# Patient Record
Sex: Male | Born: 1980 | Race: White | Hispanic: No | Marital: Married | Smoking: Current every day smoker
Health system: Southern US, Community
[De-identification: ages and names within clinical notes are randomized; demographics above are authoritative.]

---

## 2014-01-18 ENCOUNTER — Emergency Department (HOSPITAL_COMMUNITY)
Admission: EM | Admit: 2014-01-18 | Discharge: 2014-01-18 | Disposition: A | Discharge status: Home / Self Care | Payer: Self-pay | Attending: Emergency Medicine | Admitting: Emergency Medicine

## 2014-01-18 ENCOUNTER — Encounter (HOSPITAL_COMMUNITY): Payer: Self-pay | Admitting: Emergency Medicine

## 2014-01-18 ENCOUNTER — Emergency Department (HOSPITAL_COMMUNITY): Payer: Self-pay

## 2014-01-18 DIAGNOSIS — Z720 Tobacco use: Secondary | ICD-10-CM | POA: Insufficient documentation

## 2014-01-18 DIAGNOSIS — J9801 Acute bronchospasm: Secondary | ICD-10-CM | POA: Insufficient documentation

## 2014-01-18 MED ORDER — PREDNISONE 10 MG PO TABS: ORAL_TABLET | ORAL | Status: DC

## 2014-01-18 MED ORDER — PREDNISONE 50 MG PO TABS
60.0000 mg | ORAL_TABLET | Freq: Once | ORAL | Status: AC
  Administered 2014-01-18: 60 mg via ORAL
  Filled 2014-01-18 (×2): qty 1

## 2014-01-18 MED ORDER — ALBUTEROL SULFATE HFA 108 (90 BASE) MCG/ACT IN AERS
2.0000 | INHALATION_SPRAY | Freq: Once | RESPIRATORY_TRACT | Status: AC
  Administered 2014-01-18: 2 via RESPIRATORY_TRACT
  Filled 2014-01-18: qty 6.7

## 2014-01-18 MED ORDER — IPRATROPIUM-ALBUTEROL 0.5-2.5 (3) MG/3ML IN SOLN
3.0000 mL | Freq: Once | RESPIRATORY_TRACT | Status: AC
  Administered 2014-01-18: 3 mL via RESPIRATORY_TRACT
  Filled 2014-01-18: qty 3

## 2014-01-18 MED ORDER — AZITHROMYCIN 250 MG PO TABS: 250.0000 mg | ORAL_TABLET | Freq: Every day | ORAL | Status: DC

## 2014-01-18 MED ORDER — ALBUTEROL SULFATE (2.5 MG/3ML) 0.083% IN NEBU
2.5000 mg | INHALATION_SOLUTION | Freq: Once | RESPIRATORY_TRACT | Status: AC
  Administered 2014-01-18: 2.5 mg via RESPIRATORY_TRACT
  Filled 2014-01-18: qty 3

## 2014-01-18 NOTE — ED Provider Notes (Signed)
CSN: [id number]     Arrival date & time 01/18/14  1844 History    The patient was seen in room APFT23/APFT23 and the patient's care was started at 8:06 PM.    Chief Complaint  Patient presents with  . Shortness of Breath     HPI HPI Comments: Larry Levy is a 33 y.o. male who presents to the Emergency Department complaining of  cough, wheezing, and shortness of breath for 3 weeks. Patient reports worsening symptoms early in the morning and late evenings. States cough has been productive of clear sputum. He also reports wheezing and states his symptoms seem to improve during the day. He has been taking  over the counter cold and cough medications without relief. He also describes tightness in his chest with excessive coughing. He denies fever, chills,  sore throat, chest pain, or bloody sputum.   History reviewed. No pertinent past medical history. History reviewed. No pertinent past surgical history. History reviewed. No pertinent family history. History  Substance Use Topics  . Smoking status: Current Every Day Smoker  . Smokeless tobacco: Not on file  . Alcohol Use: Yes    Review of Systems  Constitutional: Negative for fever, chills, activity change, appetite change and fatigue.  HENT: Negative for congestion, ear discharge, facial swelling, sinus pressure, sore throat and trouble swallowing.   Eyes: Negative for discharge and visual disturbance.  Respiratory: Positive for cough, chest tightness, shortness of breath and wheezing. Negative for stridor.   Cardiovascular: Negative for chest pain.  Gastrointestinal: Negative for nausea, vomiting, abdominal pain and diarrhea.  Genitourinary: Negative for frequency and hematuria.  Musculoskeletal: Negative for back pain, neck pain and neck stiffness.  Skin: Negative.  Negative for rash.  Neurological: Negative for dizziness, seizures, weakness, numbness and headaches.  Hematological: Negative for adenopathy.   Psychiatric/Behavioral: Negative for hallucinations and confusion.  All other systems reviewed and are negative.     Allergies  Review of patient's allergies indicates no known allergies.  Home Medications   Prior to Admission medications   Not on File   BP 139/78  Pulse 74  Temp(Src) 98.8 F (37.1 C) (Oral)  Resp 18  Ht 5\' 10"  (1.778 m)  Wt 180 lb (81.647 kg)  BMI 25.83 kg/m2  SpO2 96% Physical Exam  Nursing note and vitals reviewed. Constitutional: He is oriented to person, place, and time. He appears well-developed and well-nourished. No distress.  HENT:  Head: Normocephalic and atraumatic.  Right Ear: Tympanic membrane and ear canal normal.  Left Ear: Tympanic membrane and ear canal normal.  Mouth/Throat: Uvula is midline, oropharynx is clear and moist and mucous membranes are normal. No oropharyngeal exudate.  Eyes: Conjunctivae and EOM are normal. Pupils are equal, round, and reactive to light. No scleral icterus.  Neck: Normal range of motion, full passive range of motion without pain and phonation normal. Neck supple. No thyromegaly present.  Cardiovascular: Normal rate, regular rhythm, normal heart sounds and intact distal pulses.  Exam reveals no gallop and no friction rub.   No murmur heard. Pulmonary/Chest: Effort normal. No stridor. No respiratory distress. He has wheezes. He has no rales. He exhibits no tenderness.  Coarse lungs sounds bilaterally with inspiratory and expiratory wheezes throughout. Slightly diminished breath sounds bilaterally. No rales.  Abdominal: Soft. He exhibits no distension. There is no tenderness. There is no rebound.  Musculoskeletal: Normal range of motion. He exhibits no edema.  Lymphadenopathy:    He has no cervical adenopathy.  Neurological: He is  alert and oriented to person, place, and time. He exhibits normal muscle tone. Coordination normal.  Skin: Skin is warm and dry. No rash noted. No erythema.  Psychiatric: He has a  normal mood and affect. His behavior is normal.    ED Course  Procedures (including critical care time) DIAGNOSTIC STUDIES: Oxygen Saturation is 96% on room air, normal by my interpretation.    COORDINATION OF CARE: 8:06 PM- Pt advised of plan for treatment and pt agrees.  Labs Review Labs Reviewed - No data to display  Imaging Review Dg Chest 2 View  01/18/2014   CLINICAL DATA:  33 year old male with productive cough and mild shortness of breath. History of smoking  EXAM: CHEST - 2 VIEW  COMPARISON:  None.  FINDINGS: Cardiomediastinal silhouette projects within normal limits in size and contour. No confluent airspace disease, pneumothorax, or pleural effusion.  No displaced fracture.  Unremarkable appearance of the upper abdomen.  IMPRESSION: No radiographic evidence of acute cardiopulmonary disease.  Signed,  Yvone NeuJaime S. Loreta AveWagner, DO  [facility]  [facility]   Electronically Signed   By: Gilmer MorJaime  Wagner D.O.   On: 01/18/2014 20:03     EKG Interpretation None      MDM   Final diagnoses:  Cough due to bronchospasm   Lung sounds greatly improved after neb. Vital signs are stable. Clinical suspicion for PE is low. Patient is nontoxic appearing. Patient agrees to albuterol inhaler , prednisone and Zithromax. He appears stable for discharge and agrees to care plan. Have also advised to return here for any worsening symptoms.     Venice Liz L. Trisha Mangleriplett, PA-C 01/20/14 1939

## 2014-01-18 NOTE — ED Notes (Signed)
Sob for 3 weeks, cough,

## 2014-01-18 NOTE — Discharge Instructions (Signed)
Bronchospasm A bronchospasm is when the tubes that carry air in and out of your lungs (airways) spasm or tighten. During a bronchospasm it is hard to breathe. This is because the airways get smaller. A bronchospasm can be triggered by:  Allergies. These may be to animals, pollen, food, or mold.  Infection. This is a common cause of bronchospasm.  Exercise.  Irritants. These include pollution, cigarette smoke, strong odors, aerosol sprays, and paint fumes.  Weather changes.  Stress.  Being emotional. HOME CARE   Always have a plan for getting help. Know when to call your doctor and local emergency services (911 in the [place] Know where you can get emergency care.  Only take medicines as told by your doctor.  If you were prescribed an inhaler or nebulizer machine, ask your doctor how to use it correctly. Always use a spacer with your inhaler if you were given one.  Stay calm during an attack. Try to relax and breathe more slowly.  Control your home environment:  Change your heating and air conditioning filter at least once a month.  Limit your use of fireplaces and wood stoves.  Do not  smoke. Do not  allow smoking in your home.  Avoid perfumes and fragrances.  Get rid of pests (such as roaches and mice) and their droppings.  Throw away plants if you see mold on them.  Keep your house clean and dust free.  Replace carpet with wood, tile, or vinyl flooring. Carpet can trap dander and dust.  Use allergy-proof pillows, mattress covers, and box spring covers.  Wash bed sheets and blankets every week in hot water. Dry them in a dryer.  Use blankets that are made of polyester or cotton.  Wash hands frequently. GET HELP IF:  You have muscle aches.  You have chest pain.  The thick spit you spit or cough up (sputum) changes from clear or white to yellow, green, gray, or bloody.  The thick spit you spit or cough up gets thicker.  There are problems that may be related  to the medicine you are given such as:  A rash.  Itching.  Swelling.  Trouble breathing. GET HELP RIGHT AWAY IF:  You feel you cannot breathe or catch your breath.  You cannot stop coughing.  Your treatment is not helping you breathe better.  You have very bad chest pain. MAKE SURE YOU:   Understand these instructions.  Will watch your condition.  Will get help right away if you are not doing well or get worse. Document Released: 01/18/2009 Document Revised: 03/28/2013 Document Reviewed: 09/13/2012 Exit[employer] Patient Information 2015 [employer]. This information is not intended to replace advice given to you by your health care provider. Make sure you discuss any questions you have with your health care provider.  

## 2014-01-21 NOTE — ED Provider Notes (Signed)
Medical screening examination/treatment/procedure(s) were performed by non-physician practitioner and as supervising physician I was immediately available for consultation/collaboration.   EKG Interpretation None        Reginold Beale L Friend Dorfman, MD 01/21/14 0025 

## 2014-04-24 ENCOUNTER — Encounter (HOSPITAL_COMMUNITY): Payer: Self-pay | Admitting: Emergency Medicine

## 2014-04-24 ENCOUNTER — Emergency Department (HOSPITAL_COMMUNITY)
Admission: EM | Admit: 2014-04-24 | Discharge: 2014-04-24 | Disposition: A | Discharge status: Home / Self Care | Payer: Self-pay | Attending: Emergency Medicine | Admitting: Emergency Medicine

## 2014-04-24 ENCOUNTER — Emergency Department (HOSPITAL_COMMUNITY): Payer: Self-pay

## 2014-04-24 DIAGNOSIS — Z792 Long term (current) use of antibiotics: Secondary | ICD-10-CM | POA: Insufficient documentation

## 2014-04-24 DIAGNOSIS — Z720 Tobacco use: Secondary | ICD-10-CM | POA: Insufficient documentation

## 2014-04-24 DIAGNOSIS — R059 Cough, unspecified: Secondary | ICD-10-CM

## 2014-04-24 DIAGNOSIS — J9801 Acute bronchospasm: Secondary | ICD-10-CM

## 2014-04-24 DIAGNOSIS — R05 Cough: Secondary | ICD-10-CM

## 2014-04-24 DIAGNOSIS — Z7952 Long term (current) use of systemic steroids: Secondary | ICD-10-CM | POA: Insufficient documentation

## 2014-04-24 MED ORDER — AZITHROMYCIN 250 MG PO TABS: 250.0000 mg | ORAL_TABLET | Freq: Every day | ORAL | Status: AC

## 2014-04-24 MED ORDER — ALBUTEROL SULFATE HFA 108 (90 BASE) MCG/ACT IN AERS
2.0000 | INHALATION_SPRAY | Freq: Once | RESPIRATORY_TRACT | Status: AC
  Administered 2014-04-24: 2 via RESPIRATORY_TRACT
  Filled 2014-04-24: qty 6.7

## 2014-04-24 MED ORDER — ALBUTEROL SULFATE (2.5 MG/3ML) 0.083% IN NEBU
2.5000 mg | INHALATION_SOLUTION | Freq: Once | RESPIRATORY_TRACT | Status: AC
  Administered 2014-04-24: 2.5 mg via RESPIRATORY_TRACT
  Filled 2014-04-24: qty 3

## 2014-04-24 MED ORDER — PREDNISONE 10 MG PO TABS: ORAL_TABLET | ORAL | Status: AC

## 2014-04-24 MED ORDER — PREDNISONE 50 MG PO TABS
60.0000 mg | ORAL_TABLET | Freq: Once | ORAL | Status: AC
  Administered 2014-04-24: 60 mg via ORAL
  Filled 2014-04-24 (×2): qty 1

## 2014-04-24 MED ORDER — IPRATROPIUM-ALBUTEROL 0.5-2.5 (3) MG/3ML IN SOLN
3.0000 mL | Freq: Once | RESPIRATORY_TRACT | Status: AC
  Administered 2014-04-24: 3 mL via RESPIRATORY_TRACT
  Filled 2014-04-24: qty 3

## 2014-04-24 NOTE — Discharge Instructions (Signed)
Bronchospasm A bronchospasm is when the tubes that carry air in and out of your lungs (airways) spasm or tighten. During a bronchospasm it is hard to breathe. This is because the airways get smaller. A bronchospasm can be triggered by:  Allergies. These may be to animals, pollen, food, or mold.  Infection. This is a common cause of bronchospasm.  Exercise.  Irritants. These include pollution, cigarette smoke, strong odors, aerosol sprays, and paint fumes.  Weather changes.  Stress.  Being emotional. HOME CARE   Always have a plan for getting help. Know when to call your doctor and local emergency services (911 in the [place] Know where you can get emergency care.  Only take medicines as told by your doctor.  If you were prescribed an inhaler or nebulizer machine, ask your doctor how to use it correctly. Always use a spacer with your inhaler if you were given one.  Stay calm during an attack. Try to relax and breathe more slowly.  Control your home environment:  Change your heating and air conditioning filter at least once a month.  Limit your use of fireplaces and wood stoves.  Do not  smoke. Do not  allow smoking in your home.  Avoid perfumes and fragrances.  Get rid of pests (such as roaches and mice) and their droppings.  Throw away plants if you see mold on them.  Keep your house clean and dust free.  Replace carpet with wood, tile, or vinyl flooring. Carpet can trap dander and dust.  Use allergy-proof pillows, mattress covers, and box spring covers.  Wash bed sheets and blankets every week in hot water. Dry them in a dryer.  Use blankets that are made of polyester or cotton.  Wash hands frequently. GET HELP IF:  You have muscle aches.  You have chest pain.  The thick spit you spit or cough up (sputum) changes from clear or white to yellow, green, gray, or bloody.  The thick spit you spit or cough up gets thicker.  There are problems that may be related  to the medicine you are given such as:  A rash.  Itching.  Swelling.  Trouble breathing. GET HELP RIGHT AWAY IF:  You feel you cannot breathe or catch your breath.  You cannot stop coughing.  Your treatment is not helping you breathe better.  You have very bad chest pain. MAKE SURE YOU:   Understand these instructions.  Will watch your condition.  Will get help right away if you are not doing well or get worse. Document Released: 01/18/2009 Document Revised: 03/28/2013 Document Reviewed: 09/13/2012 Exit[employer] Patient Information 2015 [employer]. This information is not intended to replace advice given to you by your health care provider. Make sure you discuss any questions you have with your health care provider.  

## 2014-04-24 NOTE — ED Notes (Signed)
Pt states he was here a couple of months ago and was told to stop smoking which he said he did but has since started back. NAD noted.

## 2014-04-24 NOTE — ED Provider Notes (Signed)
CSN: [id number]     Arrival date & time 04/24/14  1913 History   First MD Initiated Contact with Patient 04/24/14 1944     Chief Complaint  Patient presents with  . Cough  . Shortness of Breath     (Consider location/radiation/quality/duration/timing/severity/associated sxs/prior Treatment) HPI  Larry Levy is a 34 y.o. male who presents to the Emergency Department complaining of cough and shortness of breath for several days.  He states that he was seen here in October for same thing and was advised to stop smoking which he did, but start back.  He also c/o chest tightness and shortness of breath on exertion.  Cough is non-productive.  He denies fever, sore throat, chest pain, abdominal pain or nasal congestion.  He has tried OTC medications without relief.     History reviewed. No pertinent past medical history. History reviewed. No pertinent past surgical history. History reviewed. No pertinent family history. History  Substance Use Topics  . Smoking status: Current Every Day Smoker  . Smokeless tobacco: Not on file  . Alcohol Use: Yes    Review of Systems  Constitutional: Negative for fever, chills and appetite change.  HENT: Positive for congestion. Negative for sore throat and trouble swallowing.   Respiratory: Positive for cough, chest tightness, shortness of breath and wheezing.   Cardiovascular: Negative for chest pain.  Gastrointestinal: Negative for nausea, vomiting and abdominal pain.  Musculoskeletal: Negative for arthralgias, neck pain and neck stiffness.  Skin: Negative for rash.  Neurological: Negative for dizziness, weakness and numbness.  Hematological: Negative for adenopathy.  All other systems reviewed and are negative.     Allergies  Review of patient's allergies indicates no known allergies.  Home Medications   Prior to Admission medications   Medication Sig Start Date End Date Taking? Authorizing Provider  azithromycin (ZITHROMAX) 250 MG  tablet Take 1 tablet (250 mg total) by mouth daily. Take first 2 tablets together, then 1 every day until finished. 01/18/14   Tyshana Nishida L. Toriano Aikey, PA-C  predniSONE (DELTASONE) 10 MG tablet Take 6 tablets day one, 5 tablets day two, 4 tablets day three, 3 tablets day four, 2 tablets day five, then 1 tablet day six 01/18/14   Dianey Suchy L. Cleola Perryman, PA-C   BP 128/83 mmHg  Pulse 85  Temp(Src) 97.9 F (36.6 C) (Oral)  Resp 18  Ht 5\' 10"  (1.778 m)  Wt 170 lb (77.111 kg)  BMI 24.39 kg/m2  SpO2 94% Physical Exam  Constitutional: He is oriented to person, place, and time. He appears well-developed and well-nourished. No distress.  HENT:  Head: Normocephalic and atraumatic.  Right Ear: Tympanic membrane and ear canal normal.  Left Ear: Tympanic membrane and ear canal normal.  Mouth/Throat: Uvula is midline, oropharynx is clear and moist and mucous membranes are normal. No oropharyngeal exudate.  Eyes: EOM are normal. Pupils are equal, round, and reactive to light.  Neck: Normal range of motion, full passive range of motion without pain and phonation normal. Neck supple.  Cardiovascular: Normal rate, regular rhythm, normal heart sounds and intact distal pulses.   No murmur heard. Pulmonary/Chest: Effort normal. No stridor. No respiratory distress. He has wheezes. He has no rales. He exhibits no tenderness.  Coarse lungs sounds bilaterally with inspiratory and expiratory wheezing throughout.    Musculoskeletal: Normal range of motion. He exhibits no edema or tenderness.  Lymphadenopathy:    He has no cervical adenopathy.  Neurological: He is alert and oriented to person, place, and time.  He exhibits normal muscle tone. Coordination normal.  Skin: Skin is warm and dry.  Nursing note and vitals reviewed.   ED Course  Procedures (including critical care time) Labs Review Labs Reviewed - No data to display  Imaging Review Dg Chest 2 View  04/24/2014   CLINICAL DATA:  Productive cough and shortness  of breath for 6 days.  EXAM: CHEST  2 VIEW  COMPARISON:  01/18/2014  FINDINGS: The heart size and mediastinal contours are within normal limits. Mild hyperinflation and central peribronchial thickening again noted. No evidence of pulmonary infiltrate or edema. No evidence of pleural effusion. No mass or lymphadenopathy identified. The visualized skeletal structures are unremarkable.  IMPRESSION: Stable mild hyperinflation and central peribronchial thickening. No acute findings.   Electronically Signed   By: Myles Rosenthal M.D.   On: 04/24/2014 20:15     EKG Interpretation None      MDM   Final diagnoses:  Cough due to bronchospasm    Patient with hx similar sx's in October. [place]  Given albuterol nebs x 2 and prednisone.  Lung sounds improved.  MDI dispensed.  Feeling better and ready for discharge.  Advised pt to stop smoking.  Given referral info for [employer] center.  He also agrees to return here for any worsening symptoms such as increasing shortness of breath, chest or fever.      Anett Ranker L. Trisha Mangle, PA-C 04/24/14 2124  Benny Lennert, MD 04/26/14 [phone]

## 2015-05-05 IMAGING — CR DG CHEST 2V
2 series · 2 of 2 positions shown · non-contrast
Comparison: None.

CLINICAL DATA: 33-year-old male with productive cough and mild
shortness of breath. History of smoking

EXAM:
CHEST - 2 VIEW

[view not recorded (1 of 2)]
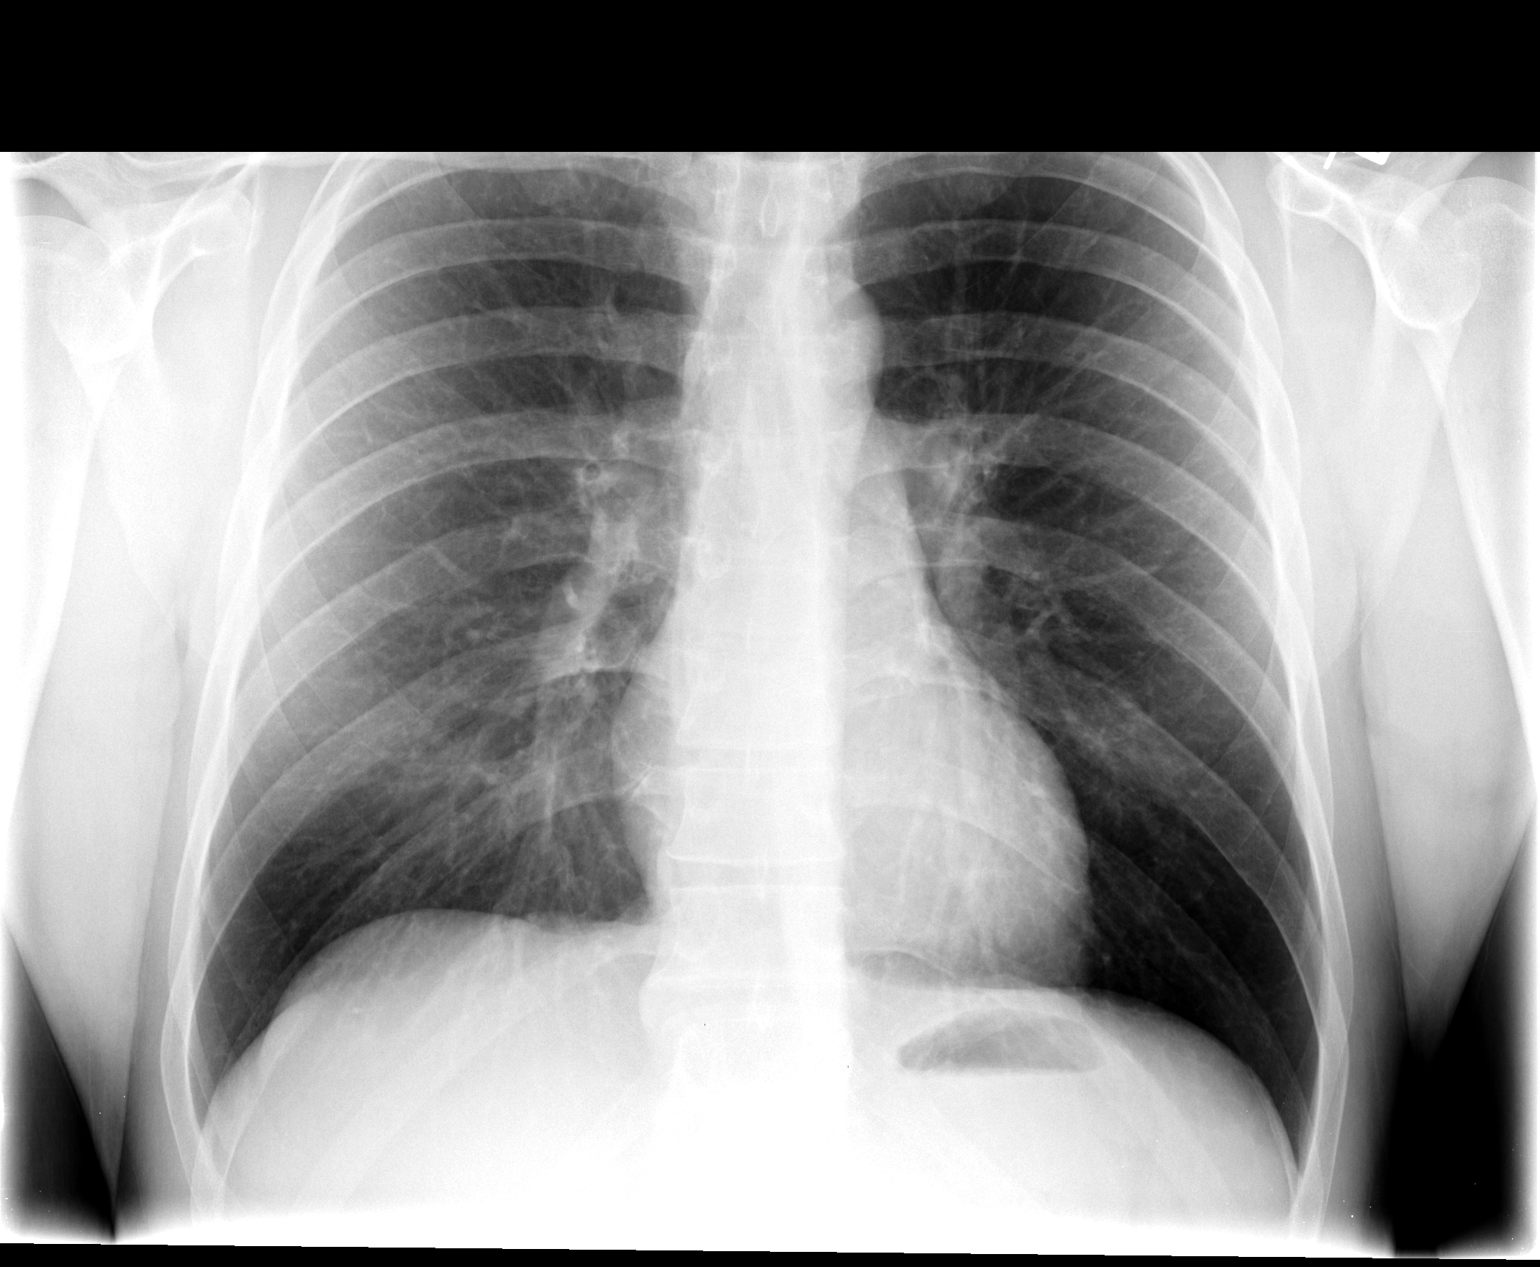

[view not recorded (2 of 2)]
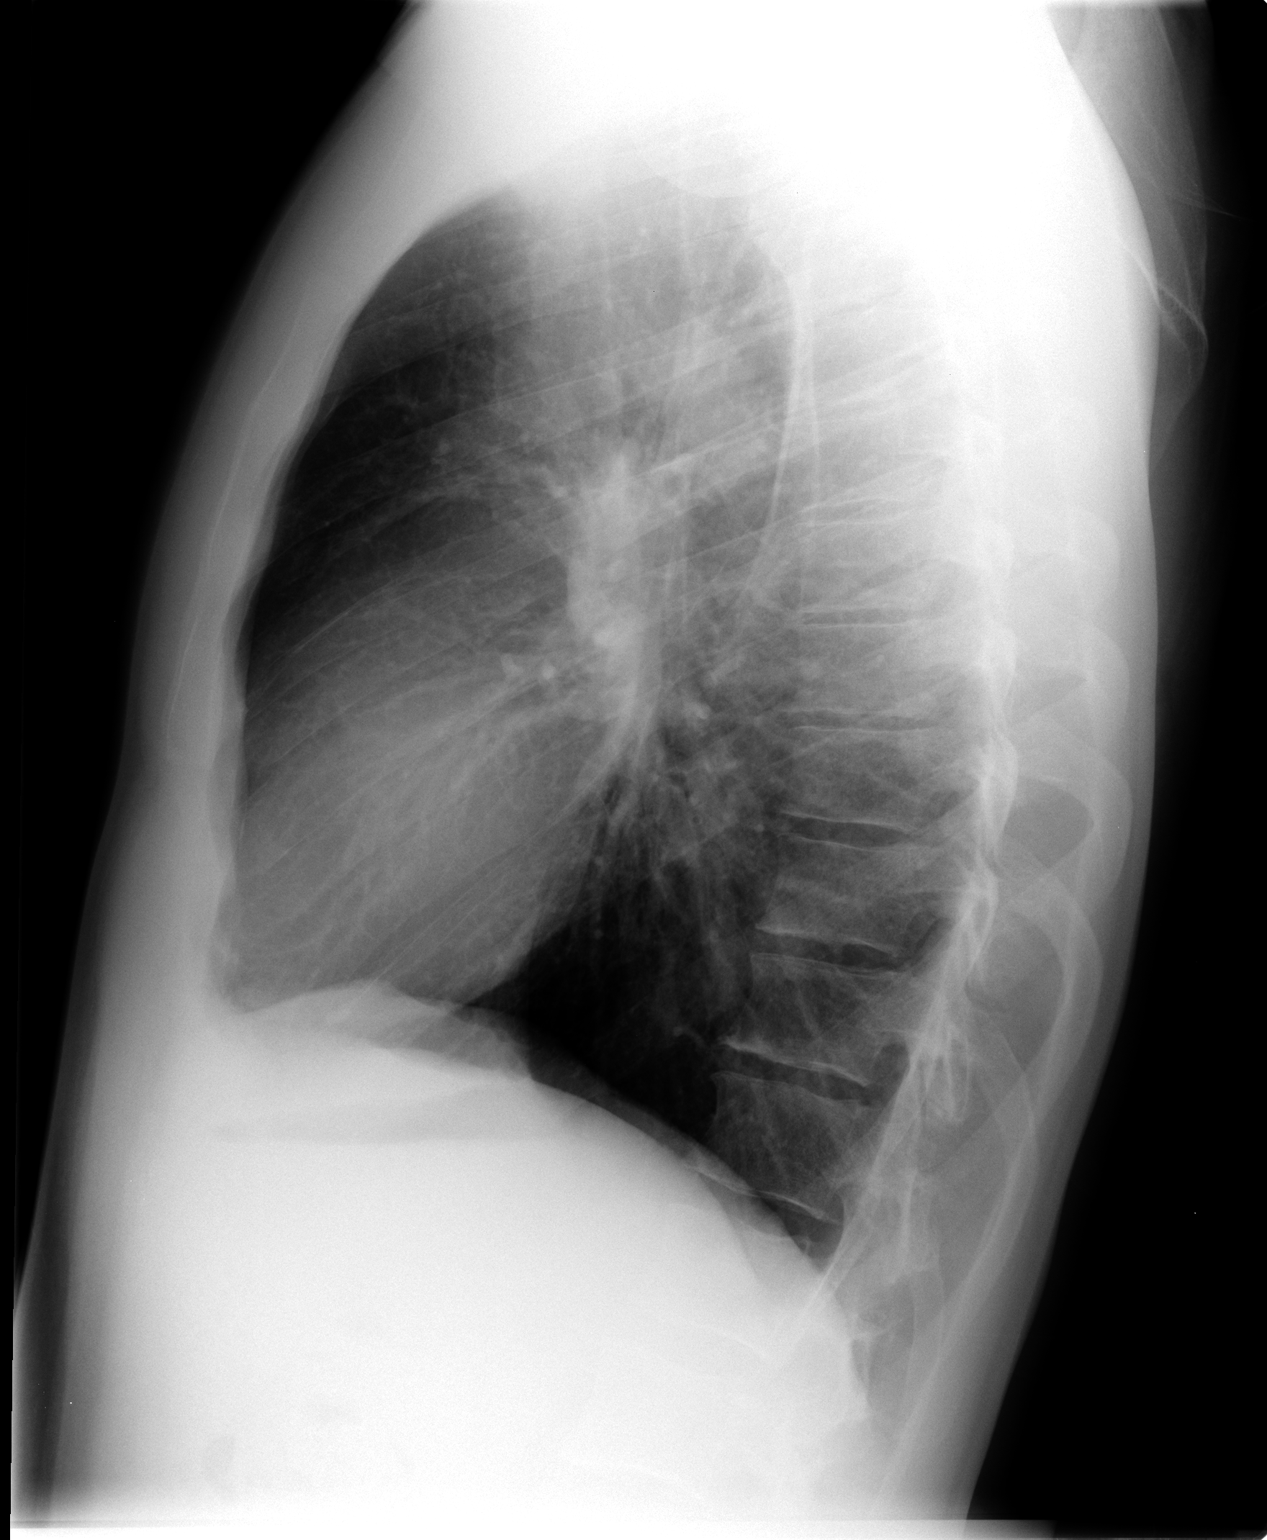

[2 of 2 positions shown; findings below may reference images not displayed]

FINDINGS: Cardiomediastinal silhouette projects within normal limits in size
and contour. No confluent airspace disease, pneumothorax, or pleural
effusion.

No displaced fracture.

Unremarkable appearance of the upper abdomen.
IMPRESSION: No radiographic evidence of acute cardiopulmonary disease.

## 2015-08-09 IMAGING — CR DG CHEST 2V
2 series · 2 of 2 positions shown · non-contrast
Comparison: 01/18/2014

CLINICAL DATA: Productive cough and shortness of breath for 6 days.

EXAM:
CHEST  2 VIEW

[view not recorded (1 of 2)]
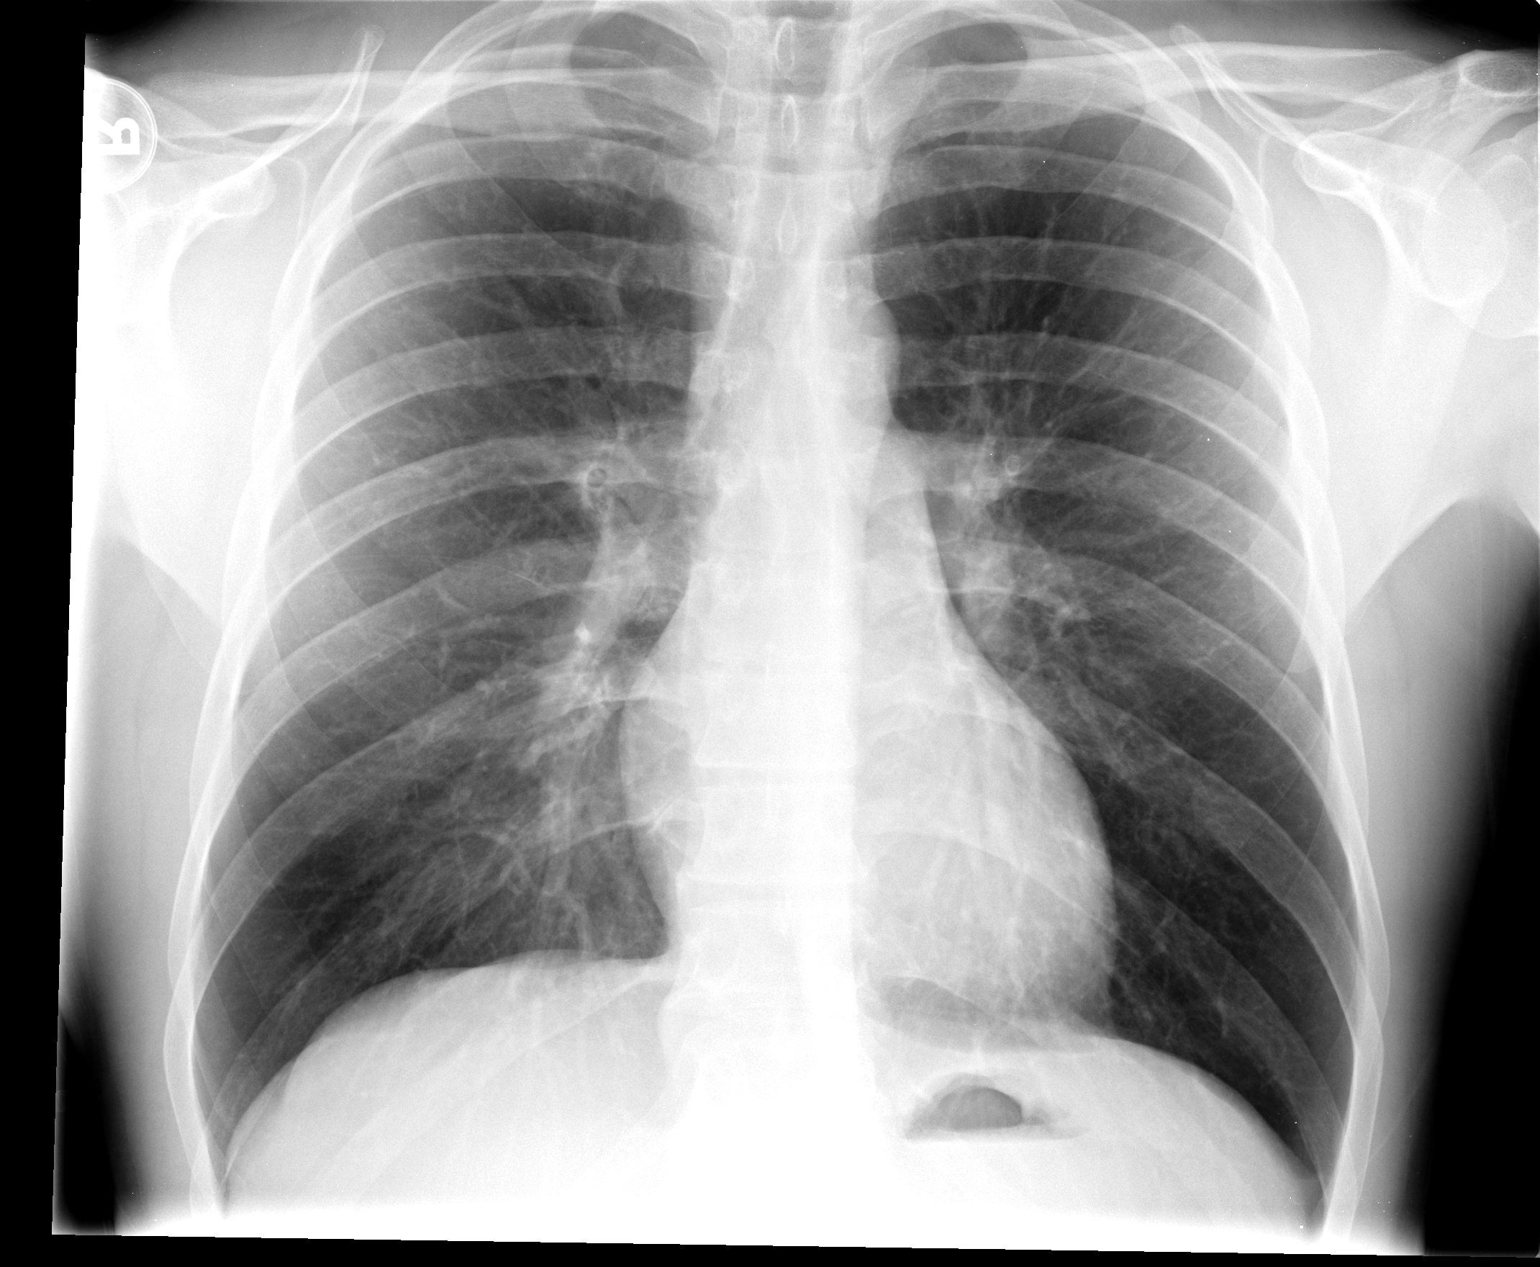

[view not recorded (2 of 2)]
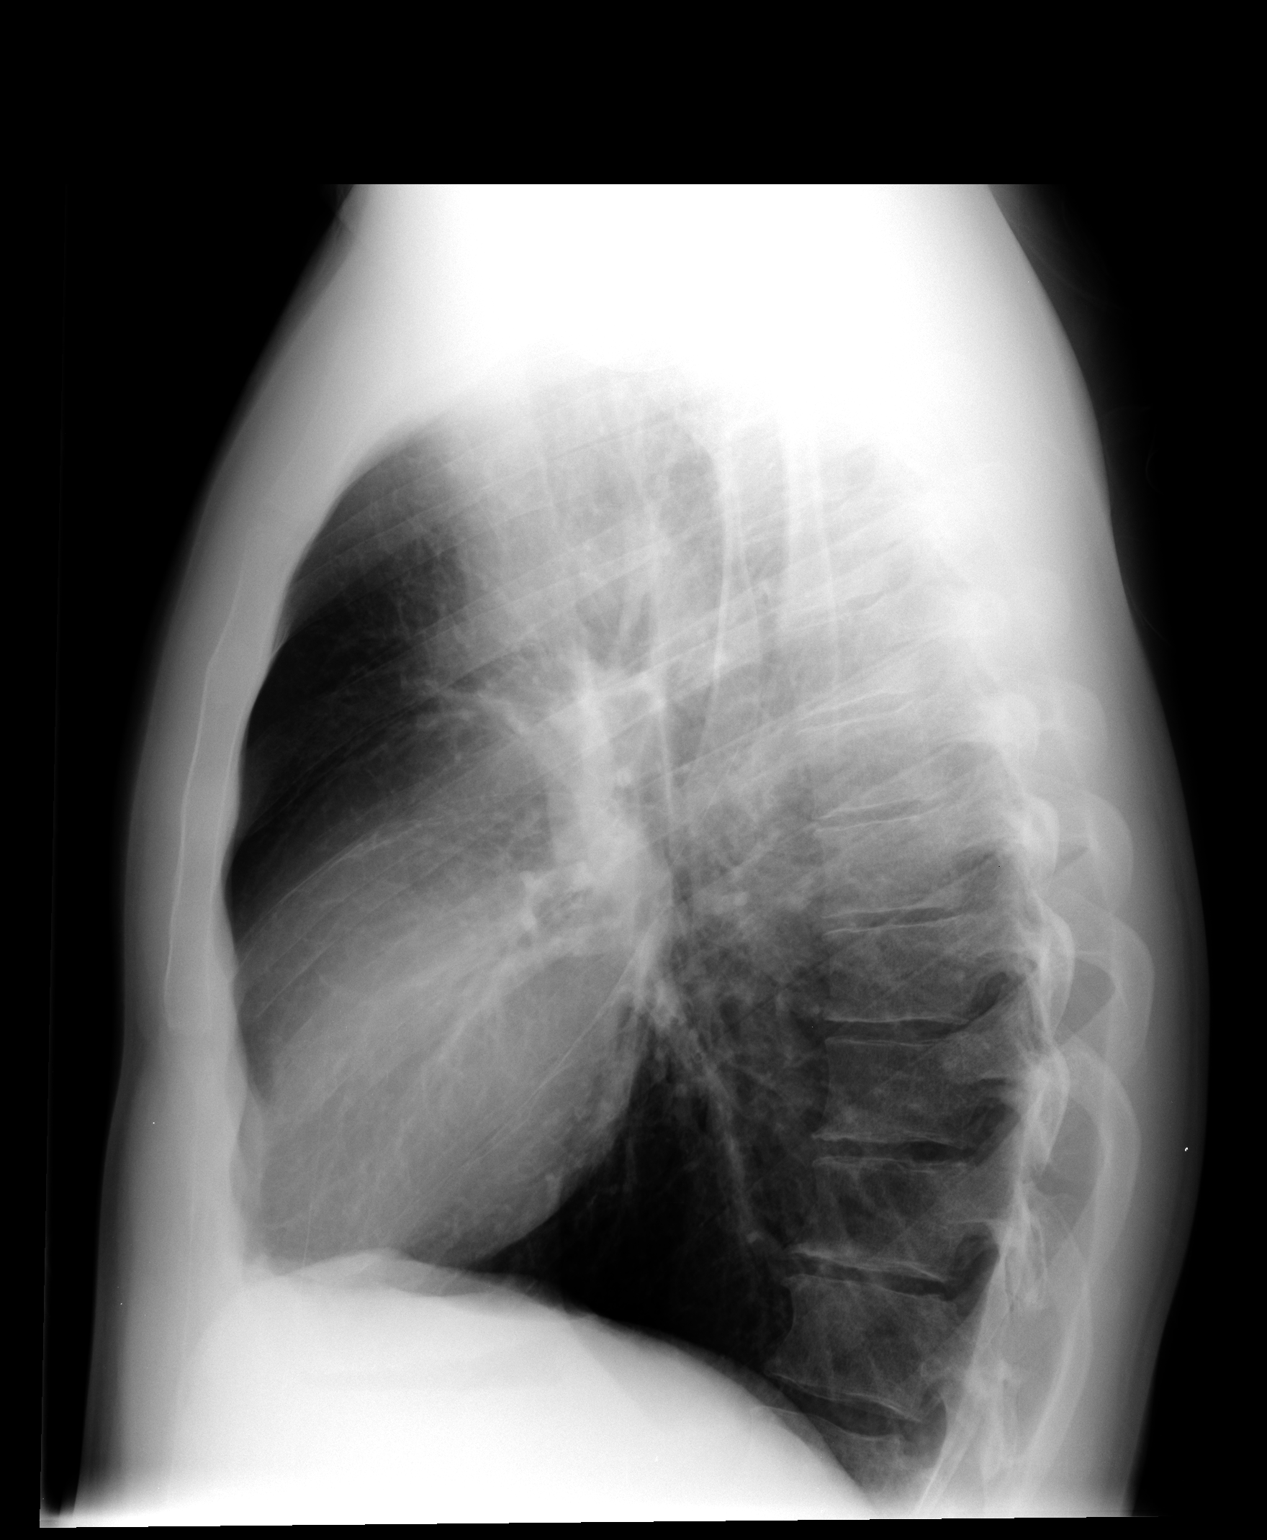

[2 of 2 positions shown; findings below may reference images not displayed]

FINDINGS: The heart size and mediastinal contours are within normal limits.
Mild hyperinflation and central peribronchial thickening again
noted. No evidence of pulmonary infiltrate or edema. No evidence of
pleural effusion. No mass or lymphadenopathy identified. The
visualized skeletal structures are unremarkable.
IMPRESSION: Stable mild hyperinflation and central peribronchial thickening. No
acute findings.
